# Patient Record
Sex: Male | Born: 1945 | Race: White | Hispanic: No | Marital: Married | Smoking: Never smoker
Health system: Southern US, Community
[De-identification: ages and names within clinical notes are randomized; demographics above are authoritative.]

## PROBLEM LIST (undated history)

## (undated) DIAGNOSIS — I219 Acute myocardial infarction, unspecified: Secondary | ICD-10-CM

## (undated) DIAGNOSIS — I10 Essential (primary) hypertension: Secondary | ICD-10-CM

## (undated) DIAGNOSIS — Z951 Presence of aortocoronary bypass graft: Secondary | ICD-10-CM

## (undated) DIAGNOSIS — T82868A Thrombosis of vascular prosthetic devices, implants and grafts, initial encounter: Secondary | ICD-10-CM

## (undated) HISTORY — PX: TONSILLECTOMY: SUR1361

## (undated) HISTORY — PX: HERNIA REPAIR: SHX51

## (undated) HISTORY — PX: EYE SURGERY: SHX253

## (undated) HISTORY — PX: APPENDECTOMY: SHX54

## (undated) HISTORY — PX: CARDIAC CATHETERIZATION: SHX172

## (undated) HISTORY — PX: JOINT REPLACEMENT: SHX530

## (undated) HISTORY — PX: CORONARY ARTERY BYPASS GRAFT: SHX141

## (undated) HISTORY — PX: VASECTOMY: SHX75

---

## 2011-04-15 ENCOUNTER — Ambulatory Visit: Payer: Self-pay | Admitting: Unknown Physician Specialty

## 2011-10-27 ENCOUNTER — Emergency Department: Payer: Self-pay

## 2011-10-27 LAB — BASIC METABOLIC PANEL
Anion Gap: 9 (ref 7–16)
Chloride: 104 mmol/L (ref 98–107)
Co2: 25 mmol/L (ref 21–32)
Creatinine: 0.99 mg/dL (ref 0.60–1.30)
EGFR (Non-African Amer.): >60
Glucose: 90 mg/dL (ref 65–99)
Osmolality: 277 (ref 275–301)
Potassium: 4.3 mmol/L (ref 3.5–5.1)
Sodium: 138 mmol/L (ref 136–145)

## 2011-10-27 LAB — TROPONIN I
Troponin-I: <0.02 ng/mL
Troponin-I: <0.02 ng/mL

## 2011-10-27 LAB — CBC
Platelet: 181 10*3/uL (ref 150–440)
RDW: 13.7 % (ref 11.5–14.5)

## 2013-03-25 ENCOUNTER — Ambulatory Visit: Payer: Self-pay | Admitting: Unknown Physician Specialty

## 2014-06-20 ENCOUNTER — Emergency Department: Payer: Self-pay | Admitting: Emergency Medicine

## 2015-09-06 ENCOUNTER — Emergency Department: Payer: Medicare Other

## 2015-09-06 ENCOUNTER — Emergency Department
Admission: EM | Admit: 2015-09-06 | Discharge: 2015-09-07 | Disposition: A | Discharge status: Home / Self Care | Payer: Medicare Other | Source: Home / Self Care | Attending: Emergency Medicine | Admitting: Emergency Medicine

## 2015-09-06 ENCOUNTER — Encounter: Payer: Self-pay | Admitting: Emergency Medicine

## 2015-09-06 ENCOUNTER — Encounter: Payer: Self-pay | Admitting: *Deleted

## 2015-09-06 ENCOUNTER — Emergency Department
Admission: EM | Admit: 2015-09-06 | Discharge: 2015-09-06 | Disposition: A | Discharge status: Home / Self Care | Payer: Medicare Other | Attending: Emergency Medicine | Admitting: Emergency Medicine

## 2015-09-06 DIAGNOSIS — R079 Chest pain, unspecified: Secondary | ICD-10-CM

## 2015-09-06 DIAGNOSIS — R42 Dizziness and giddiness: Secondary | ICD-10-CM | POA: Diagnosis not present

## 2015-09-06 DIAGNOSIS — R0789 Other chest pain: Secondary | ICD-10-CM

## 2015-09-06 HISTORY — DX: Acute myocardial infarction, unspecified: I21.9

## 2015-09-06 HISTORY — DX: Essential (primary) hypertension: I10

## 2015-09-06 HISTORY — DX: Thrombosis due to vascular prosthetic devices, implants and grafts, initial encounter: T82.868A

## 2015-09-06 HISTORY — DX: Presence of aortocoronary bypass graft: Z95.1

## 2015-09-06 LAB — BASIC METABOLIC PANEL
Anion gap: 8 (ref 5–15)
BUN: 24 mg/dL — ABNORMAL HIGH (ref 6–20)
CHLORIDE: 101 mmol/L (ref 101–111)
CO2: 26 mmol/L (ref 22–32)
CREATININE: 1.2 mg/dL (ref 0.61–1.24)
Calcium: 8.9 mg/dL (ref 8.9–10.3)
GFR calc non Af Amer: 60 mL/min — ABNORMAL LOW (ref >60)
Glucose, Bld: 89 mg/dL (ref 65–99)
Potassium: 4.4 mmol/L (ref 3.5–5.1)
SODIUM: 135 mmol/L (ref 135–145)

## 2015-09-06 LAB — CBC
HCT: 41.8 % (ref 40.0–52.0)
Hemoglobin: 14.2 g/dL (ref 13.0–18.0)
MCH: 30.4 pg (ref 26.0–34.0)
MCHC: 33.9 g/dL (ref 32.0–36.0)
MCV: 89.6 fL (ref 80.0–100.0)
PLATELETS: 211 10*3/uL (ref 150–440)
RBC: 4.67 MIL/uL (ref 4.40–5.90)
RDW: 13.6 % (ref 11.5–14.5)
WBC: 7.2 10*3/uL (ref 3.8–10.6)

## 2015-09-06 LAB — TROPONIN I

## 2015-09-06 LAB — FIBRIN DERIVATIVES D-DIMER (ARMC ONLY): FIBRIN DERIVATIVES D-DIMER (ARMC): 3373 — AB (ref 0–499)

## 2015-09-06 NOTE — Discharge Instructions (Signed)
You were evaluated for fatigue and left-sided chest pain, and although no certain cause was found, your exam and evaluation are reassuring in the emergency department tonight. I am most suspicious that your left-sided chest discomfort is coming from a musculoskeletal source such as a strained muscle.  You may take over-the-counter Tylenol or ibuprofen for symptoms.  Because of your cardiac history, I do recommend you call your cardiologist in the morning to make close follow-up to discuss any further testing for chest discomfort.  Return to the emergency department immediately for any new or worsening pain, nausea, sweating, dizziness or passing out, weakness, numbness, or any other symptoms concerning to you.   Chest Wall Pain Chest wall pain is pain in or around the bones and muscles of your chest. Sometimes, an injury causes this pain. Sometimes, the cause may not be known. This pain may take several weeks or longer to get better. HOME CARE INSTRUCTIONS  Pay attention to any changes in your symptoms. Take these actions to help with your pain:   Rest as told by your health care provider.   Avoid activities that cause pain. These include any activities that use your chest muscles or your abdominal and side muscles to lift heavy items.   If directed, apply ice to the painful area:  Put ice in a plastic bag.  Place a towel between your skin and the bag.  Leave the ice on for 20 minutes, 2-3 times per day.  Take over-the-counter and prescription medicines only as told by your health care provider.  Do not use tobacco products, including cigarettes, chewing tobacco, and e-cigarettes. If you need help quitting, ask your health care provider.  Keep all follow-up visits as told by your health care provider. This is important. SEEK MEDICAL CARE IF:  You have a fever.  Your chest pain becomes worse.  You have new symptoms. SEEK IMMEDIATE MEDICAL CARE IF:  You have nausea or  vomiting.  You feel sweaty or light-headed.  You have a cough with phlegm (sputum) or you cough up blood.  You develop shortness of breath.   This information is not intended to replace advice given to you by your health care provider. Make sure you discuss any questions you have with your health care provider.   Document Released: 07/21/2005 Document Revised: 04/11/2015 Document Reviewed: 10/16/2014 Elsevier Interactive Patient Education [employer].

## 2015-09-06 NOTE — ED Provider Notes (Signed)
[facility] Emergency Department Provider Note   ____________________________________________  Time seen: Approximately 8:45 PM I have reviewed the triage vital signs and the triage nursing note.  HISTORY  Chief Complaint Chest Pain   Historian Patient and wife  HPI Mario Wolfe is a 70 y.o. male with a history of CABG many years ago, and no additional or ongoing chest pain. He follows with a cardiologist at [facility]. Today he states that he didn't sleep well overnight which is somewhat usual for him but he was feeling pretty exhausted during the day. He felt a little bit lightheaded and when he came home in the afternoon he took a nap when he woke up from the nap he had left-sided chest pain that he noticed was worse when he lifted his arm and moved his arm around the left side. He also had tenderness at the left-sided chest wall when he pressed on it with his hand. No known overuse or traumatic injury specifically.  No trouble breathing, nausea, sweats, extension of the pain.  No weakness or numbness.  He has been off of Plavix since last April on recommendation of his cardiologist, due to no ongoing issues.    Past Medical History  Diagnosis Date  . MI (myocardial infarction) ([place])   . S/P CABG x 3   . Femoral-femoral bypass graft thrombosis, right (HCC)     There are no active problems to display for this patient.   History reviewed. No pertinent past surgical history.  No current outpatient prescriptions on file.  Allergies Bee venom and Tape  History reviewed. No pertinent family history.  Social History Social History  Substance Use Topics  . Smoking status: Never Smoker   . Smokeless tobacco: None  . Alcohol Use: None    Review of Systems  Constitutional: Negative for fever. Eyes: Negative for visual changes. ENT: Negative for sore throat. Cardiovascular: Positive for chest pain. Respiratory: Negative for shortness of  breath Gastrointestinal: Negative for abdominal pain, vomiting and diarrhea. Genitourinary: Negative for dysuria. Musculoskeletal: Negative for back pain. Skin: Negative for rash. Neurological: Negative for headache. 10 point Review of Systems otherwise negative ____________________________________________   PHYSICAL EXAM:  VITAL SIGNS: ED Triage Vitals  Enc Vitals Group     BP 09/06/15 1804 151/75 mmHg     Pulse Rate 09/06/15 1804 71     Resp 09/06/15 1804 18     Temp 09/06/15 1804 98.5 F (36.9 C)     Temp Source 09/06/15 1804 Oral     SpO2 09/06/15 1804 100 %     Weight 09/06/15 1804 240 lb (108.863 kg)     Height 09/06/15 1804 5\' 10"  (1.778 m)     Head Cir --      Peak Flow --      Pain Score 09/06/15 1757 5     Pain Loc --      Pain Edu? --      Excl. in [place]? --      Constitutional: Alert and oriented. Well appearing and in no distress. Eyes: Conjunctivae are normal. PERRL. Normal extraocular movements. ENT   Head: Normocephalic and atraumatic.   Nose: No congestion/rhinnorhea.   Mouth/Throat: Mucous membranes are moist.   Neck: No stridor. Cardiovascular/Chest: Normal rate, regular rhythm.  No murmurs, rubs, or gallops. Left pectoralis muscle tenderness palpation and pain reproducible with range of motion of the left arm as well. Respiratory: Normal respiratory effort without tachypnea nor retractions. Breath sounds are clear and  equal bilaterally. No wheezes/rales/rhonchi. Gastrointestinal: Soft. No distention, no guarding, no rebound. Nontender.    Genitourinary/rectal:Deferred Musculoskeletal: Nontender with normal range of motion in all extremities. No joint effusions.  No lower extremity tenderness.  No edema. Neurologic:  Normal speech and language. No gross or focal neurologic deficits are appreciated. Skin:  Skin is warm, dry and intact. No rash noted. Psychiatric: Mood and affect are normal. Speech and behavior are normal. Patient exhibits  appropriate insight and judgment.  ____________________________________________   EKG I, Lisa Roca, MD, the attending physician have personally viewed and interpreted all ECGs.  63 bpm. Sinus rhythm. Narrow QRS. Normal axis. Nonspecific T-wave ____________________________________________  LABS (pertinent positives/negatives)  Basic metabolic panel without significant abnormalities CBC within normal limits Troponin less than 0.03 D-dimer  3373 ____________________________________________  RADIOLOGY All Xrays were viewed by me. Imaging interpreted by Radiologist.  Chest: No focal pneumonia. Mild atelectasis or scar of left lung base __________________________________________  PROCEDURES  Procedure(s) performed: None  Critical Care performed: None  ____________________________________________   ED COURSE / ASSESSMENT AND PLAN  Pertinent labs & imaging results that were available during my care of the patient were reviewed by me and considered in my medical decision making (see chart for details).   Patient is having atypical chest discomfort which is made worse with palpation over the pectoralis on the left side of the chest as well as with range of motion of his left arm. His medical and laboratory evaluation are reassuring. Chest discomfort started around 2 PM. His EKG is reassuring. We discussed, I do not think a repeat troponin is indicated at this point in time given the onset around 2 PM.  In discussion with his vascular doctor tonight, he asked to also check a d-dimer.   He does not have pleuritic chest pain. He does not a fever. He does not have any hypoxia.  Patient states he is going to see a vascular doctor tomorrow. He will also call his cardiologist in the morning.  CONSULTATIONS:   None   Patient / Family / Caregiver informed of clinical course, medical decision-making process, and agree with plan.   I discussed return precautions, follow-up  instructions, and discharged instructions with patient and/or family.  ----------------------------------------- 9:52 PM on 09/06/2015 ----------------------------------------- Lab called d-dimer, elevated to 3073. I did call the patient back and he is on his way back to the ED to get a CT of the chest to rule out pulmonary embolus him. He did have a vein procedure on his leg this week which was supposed to "sclerose "a vein. This may be the source of the elevated d-dimer, however he's had a CT scan at this point to rule out PE. ___________________________________________   FINAL CLINICAL IMPRESSION(S) / ED DIAGNOSES   Final diagnoses:  Musculoskeletal chest pain              Note: This dictation was prepared with Dragon dictation. Any transcriptional errors that result from this process are unintentional   Lisa Roca, MD 09/06/15 2153

## 2015-09-06 NOTE — ED Notes (Signed)
Pt states left chest pain radaiting around his shoulder, states "I just feel out of it", states hx of MI and CABG

## 2015-09-06 NOTE — ED Provider Notes (Signed)
[facility] Emergency Department Provider Note  ____________________________________________  Time seen: 11:30 PM  I have reviewed the triage vital signs and the nursing notes.   HISTORY  Chief Complaint Abnormal Lab     HPI Mario Wolfe is a 70 y.o. male with history of three-vessel CABG in 2002 as well as recent venous vascular procedure performed on his right leg at [facility]. Patient was seen by Dr. Reita Cliche on 09/06/2015 after being referred by vascular surgeon to come to the emergency department for episode of chest pain that has since resolved. Patient's*surgeon requested that he had a d-dimer performed which was indeed performed during his previous emergency department visit however patient was discharged before to return. D-dimer was indeed elevated and a such patient was called and asked to return to emergency department. Patient has no complaints at this time.    Past Medical History  Diagnosis Date  . MI (myocardial infarction) ([place])   . S/P CABG x 3   . Femoral-femoral bypass graft thrombosis, right (HCC)     There are no active problems to display for this patient.   Past surgical history CABG  No current outpatient prescriptions on file.  Allergies Bee venom and Tape  No family history on file.  Social History Social History  Substance Use Topics  . Smoking status: Never Smoker   . Smokeless tobacco: None  . Alcohol Use: None    Review of Systems  Constitutional: Negative for fever. Eyes: Negative for visual changes. ENT: Negative for sore throat. Cardiovascular: Positive for chest pain since resolved Respiratory: Negative for shortness of breath. Gastrointestinal: Negative for abdominal pain, vomiting and diarrhea. Genitourinary: Negative for dysuria. Musculoskeletal: Negative for back pain. Skin: Negative for rash. Neurological: Negative for headaches, focal weakness or numbness.   10-point ROS otherwise  negative.  ____________________________________________   PHYSICAL EXAM:  VITAL SIGNS: ED Triage Vitals  Enc Vitals Group     BP 09/06/15 2326 120/61 mmHg     Pulse Rate 09/06/15 2326 69     Resp 09/06/15 2326 16     Temp 09/06/15 2326 98.4 F (36.9 C)     Temp Source 09/06/15 2326 Oral     SpO2 09/06/15 2326 98 %     Weight 09/06/15 2326 240 lb (108.863 kg)     Height 09/06/15 2326 5\' 10"  (1.778 m)     Head Cir --      Peak Flow --      Pain Score 09/06/15 2326 5     Pain Loc --      Pain Edu? --      Excl. in [place]? --      Constitutional: Alert and oriented. Well appearing and in no distress. Eyes: Conjunctivae are normal. PERRL. Normal extraocular movements. ENT   Head: Normocephalic and atraumatic.   Nose: No congestion/rhinnorhea.   Mouth/Throat: Mucous membranes are moist.   Neck: No stridor. Hematological/Lymphatic/Immunilogical: No cervical lymphadenopathy. Cardiovascular: Normal rate, regular rhythm. Normal and symmetric distal pulses are present in all extremities. No murmurs, rubs, or gallops. Respiratory: Normal respiratory effort without tachypnea nor retractions. Breath sounds are clear and equal bilaterally. No wheezes/rales/rhonchi. Gastrointestinal: Soft and nontender. No distention. There is no CVA tenderness. Genitourinary: deferred Musculoskeletal: Nontender with normal range of motion in all extremities. No joint effusions.  No lower extremity tenderness nor edema. Neurologic:  Normal speech and language. No gross focal neurologic deficits are appreciated. Speech is normal.  Skin:  Skin is warm, dry  and intact. No rash noted. Psychiatric: Mood and affect are normal. Speech and behavior are normal. Patient exhibits appropriate insight and judgment.  ____________________________________________    LABS (pertinent positives/negatives)  Labs Reviewed  TROPONIN I       RADIOLOGY  US Venous Img Lower Unilateral Right (Final result)  Result time: 09/07/15 02:32:00   Final result by Rad Results In Interface (09/07/15 02:32:00)   Narrative:   CLINICAL DATA: Chest pain. Elevated D-dimer. Patient reports history of right femoral artery bypass many years prior.  EXAM: RIGHT LOWER EXTREMITY VENOUS DOPPLER ULTRASOUND  TECHNIQUE: Gray-scale sonography with graded compression, as well as color Doppler and duplex ultrasound were performed to evaluate the lower extremity deep venous systems from the level of the common femoral vein and including the common femoral, femoral, profunda femoral, popliteal and calf veins including the posterior tibial, peroneal and gastrocnemius veins when visible. The superficial great saphenous vein was also interrogated. Spectral Doppler was utilized to evaluate flow at rest and with distal augmentation maneuvers in the common femoral, femoral and popliteal veins.  COMPARISON: None.  FINDINGS: Contralateral Common Femoral Vein: Respiratory phasicity is normal and symmetric with the symptomatic side. No evidence of thrombus. Normal compressibility.  Common Femoral Vein: No evidence of thrombus. Normal compressibility, respiratory phasicity and response to augmentation.  Saphenofemoral Junction: No evidence of thrombus. Normal compressibility and flow on color Doppler imaging.  Profunda Femoral Vein: No evidence of thrombus. Normal compressibility and flow on color Doppler imaging.  Femoral Vein: No evidence of thrombus. Normal compressibility, respiratory phasicity and response to augmentation.  Popliteal Vein: No evidence of thrombus. Normal compressibility, respiratory phasicity and response to augmentation.  Calf Veins: No evidence of thrombus. Normal compressibility and flow on color Doppler imaging.  Superficial lesser Saphenous Vein: Not visualized, may be surgically absent.  Venous Reflux: None.  Other Findings: None.  IMPRESSION: No evidence of right lower  extremity deep venous thrombosis.   Electronically Signed By: Jeb Levering M.D. On: 09/07/2015 02:32          CT Angio Chest PE W/Cm &/Or Wo Cm (Final result) Result time: 09/07/15 00:57:20   Final result by Rad Results In Interface (09/07/15 00:57:20)   Narrative:   CLINICAL DATA: Left chest pain. History of DVT and recent vascular surgery  EXAM: CT ANGIOGRAPHY CHEST WITH CONTRAST  TECHNIQUE: Multidetector CT imaging of the chest was performed using the standard protocol during bolus administration of intravenous contrast. Multiplanar CT image reconstructions and MIPs were obtained to evaluate the vascular anatomy.  CONTRAST: 145mL OMNIPAQUE IOHEXOL 350 MG/ML SOLN  COMPARISON: 10/27/2011  FINDINGS: THORACIC INLET/BODY WALL:  No acute abnormality.  Symmetric gynecomastia  MEDIASTINUM:  Normal heart size. No pericardial effusion. Extensive atherosclerotic calcification, including the coronary arteries. The patient is status post CABG. Proximal saphenous vein grafts and LIMA are opacified. No aortic aneurysm or dissection. No evidence of pulmonary embolism. Remote granulomatous disease seen in right hilar bronchial lymph nodes.  LUNG WINDOWS:  Mild diffuse bronchial wall thickening, stable. No consolidation. No effusion. No new, enlarging, or otherwise suspicious nodule.  UPPER ABDOMEN:  No acute finding. 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  OSSEOUS:  No acute fracture. No suspicious lytic or blastic lesions.  Review of the MIP images confirms the above findings.  IMPRESSION: Negative for pulmonary embolism or other acute finding.   Electronically Signed By: Monte Fantasia M.D. On: 09/07/2015 00:57  INITIAL IMPRESSION / ASSESSMENT AND PLAN / ED COURSE  Pertinent labs & imaging results that were available during my care of the patient were  reviewed by me and considered in my medical decision making (see chart for details).  CT angiogram of the chest revealed no evidence of PE right lower extremity ultrasound revealed no DVT. Patient's elevated d-dimer possibly secondary to recent vascular surgery.  ____________________________________________   FINAL CLINICAL IMPRESSION(S) / ED DIAGNOSES  Final diagnoses:  Chest pain, unspecified chest pain type      Gregor Hams, MD 09/11/15 [phone]

## 2015-09-06 NOTE — ED Notes (Signed)
Pt arrived to ER POV with c/o of elevated d-dimer. Pt was seen in the ER earlier today and was d/c. Pt was notified via telephone on elevation of d-dimer and advised to return to ER for further medical treatment.

## 2015-09-07 ENCOUNTER — Emergency Department: Payer: Medicare Other

## 2015-09-07 ENCOUNTER — Encounter: Payer: Self-pay | Admitting: Radiology

## 2015-09-07 DIAGNOSIS — R0789 Other chest pain: Secondary | ICD-10-CM | POA: Diagnosis not present

## 2015-09-07 LAB — TROPONIN I: Troponin I: <0.03 ng/mL (ref <0.031)

## 2015-09-07 MED ORDER — IOHEXOL 350 MG/ML SOLN
100.0000 mL | Freq: Once | INTRAVENOUS | Status: AC | PRN
  Administered 2015-09-07: 100 mL via INTRAVENOUS

## 2015-09-07 NOTE — Discharge Instructions (Signed)
Nonspecific Chest Pain  °Chest pain can be caused by many different conditions. There is always a chance that your pain could be related to something serious, such as a heart attack or a blood clot in your lungs. Chest pain can also be caused by conditions that are not life-threatening. If you have chest pain, it is very important to follow up with your health care provider. °CAUSES  °Chest pain can be caused by: °· Heartburn. °· Pneumonia or bronchitis. °· Anxiety or stress. °· Inflammation around your heart (pericarditis) or lung (pleuritis or pleurisy). °· A blood clot in your lung. °· A collapsed lung (pneumothorax). It can develop suddenly on its own (spontaneous pneumothorax) or from trauma to the chest. °· Shingles infection (varicella-zoster virus). °· Heart attack. °· Damage to the bones, muscles, and cartilage that make up your chest wall. This can include: °¨ Bruised bones due to injury. °¨ Strained muscles or cartilage due to frequent or repeated coughing or overwork. °¨ Fracture to one or more ribs. °¨ Sore cartilage due to inflammation (costochondritis). °RISK FACTORS  °Risk factors for chest pain may include: °· Activities that increase your risk for trauma or injury to your chest. °· Respiratory infections or conditions that cause frequent coughing. °· Medical conditions or overeating that can cause heartburn. °· Heart disease or family history of heart disease. °· Conditions or health behaviors that increase your risk of developing a blood clot. °· Having had chicken pox (varicella zoster). °SIGNS AND SYMPTOMS °Chest pain can feel like: °· Burning or tingling on the surface of your chest or deep in your chest. °· Crushing, pressure, aching, or squeezing pain. °· Dull or sharp pain that is worse when you move, cough, or take a deep breath. °· Pain that is also felt in your back, neck, shoulder, or arm, or pain that spreads to any of these areas. °Your chest pain may come and go, or it may stay  constant. °DIAGNOSIS °Lab tests or other studies may be needed to find the cause of your pain. Your health care provider may have you take a test called an ambulatory ECG (electrocardiogram). An ECG records your heartbeat patterns at the time the test is performed. You may also have other tests, such as: °· Transthoracic echocardiogram (TTE). During echocardiography, sound waves are used to create a picture of all of the heart structures and to look at how blood flows through your heart. °· Transesophageal echocardiogram (TEE). This is a more advanced imaging test that obtains images from inside your body. It allows your health care provider to see your heart in finer detail. °· Cardiac monitoring. This allows your health care provider to monitor your heart rate and rhythm in real time. °· Holter monitor. This is a portable device that records your heartbeat and can help to diagnose abnormal heartbeats. It allows your health care provider to track your heart activity for several days, if needed. °· Stress tests. These can be done through exercise or by taking medicine that makes your heart beat more quickly. °· Blood tests. °· Imaging tests. °TREATMENT  °Your treatment depends on what is causing your chest pain. Treatment may include: °· Medicines. These may include: °¨ Acid blockers for heartburn. °¨ Anti-inflammatory medicine. °¨ Pain medicine for inflammatory conditions. °¨ Antibiotic medicine, if an infection is present. °¨ Medicines to dissolve blood clots. °¨ Medicines to treat coronary artery disease. °· Supportive care for conditions that do not require medicines. This may include: °¨ Resting. °¨ Applying heat   or cold packs to injured areas. °¨ Limiting activities until pain decreases. °HOME CARE INSTRUCTIONS °· If you were prescribed an antibiotic medicine, finish it all even if you start to feel better. °· Avoid any activities that bring on chest pain. °· Do not use any tobacco products, including  cigarettes, chewing tobacco, or electronic cigarettes. If you need help quitting, ask your health care provider. °· Do not drink alcohol. °· Take medicines only as directed by your health care provider. °· Keep all follow-up visits as directed by your health care provider. This is important. This includes any further testing if your chest pain does not go away. °· If heartburn is the cause for your chest pain, you may be told to keep your head raised (elevated) while sleeping. This reduces the chance that acid will go from your stomach into your esophagus. °· Make lifestyle changes as directed by your health care provider. These may include: °¨ Getting regular exercise. Ask your health care provider to suggest some activities that are safe for you. °¨ Eating a heart-healthy diet. A registered dietitian can help you to learn healthy eating options. °¨ Maintaining a healthy weight. °¨ Managing diabetes, if necessary. °¨ Reducing stress. °SEEK MEDICAL CARE IF: °· Your chest pain does not go away after treatment. °· You have a rash with blisters on your chest. °· You have a fever. °SEEK IMMEDIATE MEDICAL CARE IF:  °· Your chest pain is worse. °· You have an increasing cough, or you cough up blood. °· You have severe abdominal pain. °· You have severe weakness. °· You faint. °· You have chills. °· You have sudden, unexplained chest discomfort. °· You have sudden, unexplained discomfort in your arms, back, neck, or jaw. °· You have shortness of breath at any time. °· You suddenly start to sweat, or your skin gets clammy. °· You feel nauseous or you vomit. °· You suddenly feel light-headed or dizzy. °· Your heart begins to beat quickly, or it feels like it is skipping beats. °These symptoms may represent a serious problem that is an emergency. Do not wait to see if the symptoms will go away. Get medical help right away. Call your local emergency services (911 in the [place] Do not drive yourself to the hospital. °  °This  information is not intended to replace advice given to you by your health care provider. Make sure you discuss any questions you have with your health care provider. °  °Document Released: 04/30/2005 Document Revised: 08/11/2014 Document Reviewed: 02/24/2014 °Elsevier Interactive Patient Education ©2016 [employer] ° °

## 2015-09-07 NOTE — ED Notes (Signed)
Patient with no complaints at this time. Respirations even and unlabored. Skin warm/dry. Discharge instructions reviewed with patient at this time. Patient given opportunity to voice concerns/ask questions. IV removed per policy and band-aid applied to site. Patient discharged at this time and left Emergency Department with steady gait.  

## 2015-09-07 NOTE — ED Notes (Signed)
Patient back from CT.

## 2015-09-07 NOTE — ED Notes (Signed)
Patient transported to CT 

## 2016-09-02 ENCOUNTER — Encounter: Payer: Self-pay | Admitting: *Deleted

## 2016-09-03 ENCOUNTER — Ambulatory Visit: Payer: Medicare Other | Admitting: Anesthesiology

## 2016-09-03 ENCOUNTER — Encounter
Admission: RE | Disposition: A | Discharge status: Home / Self Care | Payer: Self-pay | Source: Ambulatory Visit | Attending: Unknown Physician Specialty

## 2016-09-03 ENCOUNTER — Encounter: Payer: Self-pay | Admitting: *Deleted

## 2016-09-03 ENCOUNTER — Ambulatory Visit
Admission: RE | Admit: 2016-09-03 | Discharge: 2016-09-03 | Disposition: A | Discharge status: Home / Self Care | Payer: Medicare Other | Source: Ambulatory Visit | Attending: Unknown Physician Specialty | Admitting: Unknown Physician Specialty

## 2016-09-03 DIAGNOSIS — Z79899 Other long term (current) drug therapy: Secondary | ICD-10-CM | POA: Insufficient documentation

## 2016-09-03 DIAGNOSIS — I10 Essential (primary) hypertension: Secondary | ICD-10-CM | POA: Insufficient documentation

## 2016-09-03 DIAGNOSIS — K640 First degree hemorrhoids: Secondary | ICD-10-CM | POA: Diagnosis not present

## 2016-09-03 DIAGNOSIS — E669 Obesity, unspecified: Secondary | ICD-10-CM | POA: Insufficient documentation

## 2016-09-03 DIAGNOSIS — D122 Benign neoplasm of ascending colon: Secondary | ICD-10-CM | POA: Insufficient documentation

## 2016-09-03 DIAGNOSIS — Z1211 Encounter for screening for malignant neoplasm of colon: Secondary | ICD-10-CM | POA: Insufficient documentation

## 2016-09-03 DIAGNOSIS — Z951 Presence of aortocoronary bypass graft: Secondary | ICD-10-CM | POA: Diagnosis not present

## 2016-09-03 DIAGNOSIS — I252 Old myocardial infarction: Secondary | ICD-10-CM | POA: Diagnosis not present

## 2016-09-03 DIAGNOSIS — D127 Benign neoplasm of rectosigmoid junction: Secondary | ICD-10-CM | POA: Insufficient documentation

## 2016-09-03 DIAGNOSIS — Z800 Family history of malignant neoplasm of digestive organs: Secondary | ICD-10-CM | POA: Diagnosis not present

## 2016-09-03 DIAGNOSIS — Z6833 Body mass index (BMI) 33.0-33.9, adult: Secondary | ICD-10-CM | POA: Insufficient documentation

## 2016-09-03 DIAGNOSIS — K5730 Diverticulosis of large intestine without perforation or abscess without bleeding: Secondary | ICD-10-CM | POA: Insufficient documentation

## 2016-09-03 DIAGNOSIS — N289 Disorder of kidney and ureter, unspecified: Secondary | ICD-10-CM | POA: Diagnosis not present

## 2016-09-03 HISTORY — PX: COLONOSCOPY WITH PROPOFOL: SHX5780

## 2016-09-03 SURGERY — COLONOSCOPY WITH PROPOFOL
Anesthesia: General

## 2016-09-03 MED ORDER — PROPOFOL 10 MG/ML IV BOLUS
INTRAVENOUS | Status: AC
  Filled 2016-09-03: qty 40

## 2016-09-03 MED ORDER — LIDOCAINE HCL (PF) 2 % IJ SOLN
INTRAMUSCULAR | Status: DC | PRN
  Administered 2016-09-03: 50 mg

## 2016-09-03 MED ORDER — FENTANYL CITRATE (PF) 100 MCG/2ML IJ SOLN
INTRAMUSCULAR | Status: AC
  Filled 2016-09-03: qty 2

## 2016-09-03 MED ORDER — PROPOFOL 10 MG/ML IV BOLUS
INTRAVENOUS | Status: DC | PRN
  Administered 2016-09-03: 50 mg via INTRAVENOUS

## 2016-09-03 MED ORDER — MIDAZOLAM HCL 2 MG/2ML IJ SOLN
INTRAMUSCULAR | Status: AC
  Filled 2016-09-03: qty 2

## 2016-09-03 MED ORDER — PROPOFOL 500 MG/50ML IV EMUL
INTRAVENOUS | Status: AC
  Filled 2016-09-03: qty 50

## 2016-09-03 MED ORDER — FENTANYL CITRATE (PF) 100 MCG/2ML IJ SOLN
INTRAMUSCULAR | Status: DC | PRN
  Administered 2016-09-03 (×2): 25 ug via INTRAVENOUS
  Administered 2016-09-03: 50 ug via INTRAVENOUS

## 2016-09-03 MED ORDER — PROPOFOL 500 MG/50ML IV EMUL
INTRAVENOUS | Status: DC | PRN
  Administered 2016-09-03: 75 ug/kg/min via INTRAVENOUS

## 2016-09-03 MED ORDER — SODIUM CHLORIDE 0.9 % IV SOLN: INTRAVENOUS | Status: DC

## 2016-09-03 MED ORDER — PIPERACILLIN-TAZOBACTAM 3.375 G IVPB 30 MIN
3.3750 g | Freq: Once | INTRAVENOUS | Status: AC
  Administered 2016-09-03: 3.375 g via INTRAVENOUS
  Filled 2016-09-03: qty 50

## 2016-09-03 MED ORDER — PHENYLEPHRINE HCL 10 MG/ML IJ SOLN
INTRAMUSCULAR | Status: DC | PRN
  Administered 2016-09-03: 100 ug via INTRAVENOUS

## 2016-09-03 MED ORDER — SODIUM CHLORIDE 0.9 % IV SOLN
INTRAVENOUS | Status: DC
  Administered 2016-09-03: 14:00:00 via INTRAVENOUS

## 2016-09-03 MED ORDER — MIDAZOLAM HCL 5 MG/5ML IJ SOLN
INTRAMUSCULAR | Status: DC | PRN
  Administered 2016-09-03: 2 mg via INTRAVENOUS

## 2016-09-03 NOTE — Anesthesia Procedure Notes (Signed)
Date/Time: 09/03/2016 3:18 PM Performed by: Nelda Marseille Pre-anesthesia Checklist: Patient identified, Emergency Drugs available, Suction available, Patient being monitored and Timeout performed Oxygen Delivery Method: Nasal cannula

## 2016-09-03 NOTE — Anesthesia Preprocedure Evaluation (Signed)
Anesthesia Evaluation  Patient identified by MRN, date of birth, ID band Patient awake    Reviewed: Allergy & Precautions, NPO status , Patient's Chart, lab work & pertinent test results  History of Anesthesia Complications Negative for: history of anesthetic complications  Airway Mallampati: II       Dental  (+) Teeth Intact   Pulmonary neg pulmonary ROS,     + decreased breath sounds      Cardiovascular hypertension, Pt. on medications and Pt. on home beta blockers + Past MI   Rhythm:Regular Rate:Normal     Neuro/Psych negative neurological ROS  negative psych ROS   GI/Hepatic negative GI ROS, Neg liver ROS,   Endo/Other  Morbid obesity  Renal/GU Renal disease     Musculoskeletal   Abdominal (+) + obese,   Peds  Hematology   Anesthesia Other Findings   Reproductive/Obstetrics                             Anesthesia Physical Anesthesia Plan  ASA: III  Anesthesia Plan: General   Post-op Pain Management:    Induction: Intravenous  Airway Management Planned: Natural Airway and Nasal Cannula  Additional Equipment:   Intra-op Plan:   Post-operative Plan:   Informed Consent: I have reviewed the patients History and Physical, chart, labs and discussed the procedure including the risks, benefits and alternatives for the proposed anesthesia with the patient or authorized representative who has indicated his/her understanding and acceptance.     Plan Discussed with: CRNA and Surgeon  Anesthesia Plan Comments:         Anesthesia Quick Evaluation

## 2016-09-03 NOTE — Transfer of Care (Signed)
Immediate Anesthesia Transfer of Care Note  Patient: Mario Wolfe  Procedure(s) Performed: Procedure(s): COLONOSCOPY WITH PROPOFOL (N/A)  Patient Location: PACU  Anesthesia Type:General  Level of Consciousness: sedated  Airway & Oxygen Therapy: Patient Spontanous Breathing and Patient connected to nasal cannula oxygen  Post-op Assessment: Report given to RN and Post -op Vital signs reviewed and stable  Post vital signs: Reviewed and stable  Last Vitals:  Vitals:   09/03/16 1332  BP: (!) 83/71  Pulse: 60  Resp: 20  Temp: 36.6 C    Last Pain: There were no vitals filed for this visit.       Complications: No apparent anesthesia complications

## 2016-09-03 NOTE — Anesthesia Post-op Follow-up Note (Cosign Needed)
Anesthesia QCDR form completed.        

## 2016-09-03 NOTE — H&P (Signed)
Primary Care Physician:  Lolita Patella, MD Primary Gastroenterologist:  Dr. Vira Agar  Pre-Procedure History & Physical: HPI:  Mario Wolfe is a 71 y.o. male is here for an colonoscopy.   Past Medical History:  Diagnosis Date  . Femoral-femoral bypass graft thrombosis, right ([place])   . Hypertension   . MI (myocardial infarction)   . S/P CABG x 3     Past Surgical History:  Procedure Laterality Date  . APPENDECTOMY    . CARDIAC CATHETERIZATION    . CORONARY ARTERY BYPASS GRAFT    . EYE SURGERY    . HERNIA REPAIR    . JOINT REPLACEMENT    . TONSILLECTOMY    . VASECTOMY      Prior to Admission medications   Medication Sig Start Date End Date Taking? Authorizing Provider  atorvastatin (LIPITOR) 80 MG tablet Take 80 mg by mouth daily.   Yes Historical Provider, MD  carvedilol (COREG) 12.5 MG tablet Take 12.5 mg by mouth 2 (two) times daily with a meal.   Yes Historical Provider, MD  clindamycin (CLEOCIN) 300 MG capsule Take 300 mg by mouth 3 (three) times daily.   Yes Historical Provider, MD  ezetimibe (ZETIA) 10 MG tablet Take 10 mg by mouth daily.   Yes Historical Provider, MD  hydrochlorothiazide (MICROZIDE) 12.5 MG capsule Take 12.5 mg by mouth daily.   Yes Historical Provider, MD  levothyroxine (SYNTHROID, LEVOTHROID) 100 MCG tablet Take 100 mcg by mouth daily before breakfast.   Yes Historical Provider, MD  lisinopril (PRINIVIL,ZESTRIL) 20 MG tablet Take 20 mg by mouth daily.   Yes Historical Provider, MD  zolpidem (AMBIEN) 10 MG tablet Take 10 mg by mouth at bedtime as needed for sleep.   Yes Historical Provider, MD  diclofenac sodium (VOLTAREN) 1 % GEL Apply 2 g topically 4 (four) times daily.    Historical Provider, MD    Allergies as of 08/14/2016 - Review Complete 09/07/2015  Allergen Reaction Noted  . Bee venom  09/06/2015  . Tape  09/06/2015    History reviewed. No pertinent family history.  Social History   Social History  . Marital status: Married   Spouse name: N/A  . Number of children: N/A  . Years of education: N/A   Occupational History  . Not on file.   Social History Main Topics  . Smoking status: Never Smoker  . Smokeless tobacco: Former Systems developer  . Alcohol use Yes  . Drug use: No  . Sexual activity: Not on file   Other Topics Concern  . Not on file   Social History Narrative  . No narrative on file    Review of Systems: See HPI, otherwise negative ROS  Physical Exam: BP (!) 83/71   Pulse 60   Temp 97.9 F (36.6 C)   Resp 20   Ht 5\' 10"  (1.778 m)   Wt 106.1 kg (234 lb)   SpO2 100%   BMI 33.58 kg/m  General:   Alert,  pleasant and cooperative in NAD Head:  Normocephalic and atraumatic. Neck:  Supple; no masses or thyromegaly. Lungs:  Clear throughout to auscultation.    Heart:  Regular rate and rhythm. Abdomen:  Soft, nontender and nondistended. Normal bowel sounds, without guarding, and without rebound.   Neurologic:  Alert and  oriented x4;  grossly normal neurologically.  Impression/Plan: OTHER SCHNITZER is here for an colonoscopy to be performed for screening for family history of colon cancer in father  Risks, benefits, limitations,  and alternatives regarding  colonoscopy have been reviewed with the patient.  Questions have been answered.  All parties agreeable.   Gaylyn Cheers, MD  09/03/2016, 3:00 PM

## 2016-09-03 NOTE — Op Note (Signed)
[facility] Gastroenterology Patient Name: Mario Wolfe Procedure Date: 09/03/2016 2:25 PM MRN: [id number] Account #: [id number] Date of Birth: October 29, 1945 Admit Type: Outpatient Age: 71 Room: [facility] ENDO ROOM 4 Gender: Male Note Status: Finalized Procedure:            Colonoscopy Indications:          Screening in patient at increased risk: Family history                        of 1st-degree relative with colorectal cancer Providers:            Manya Silvas, MD Referring MD:         Bo Mcclintock. Vickki Muff (Referring MD) Medicines:            Propofol per Anesthesia Complications:        No immediate complications. Procedure:            Pre-Anesthesia Assessment:                       - After reviewing the risks and benefits, the patient                        was deemed in satisfactory condition to undergo the                        procedure.                       After obtaining informed consent, the colonoscope was                        passed under direct vision. Throughout the procedure,                        the patient's blood pressure, pulse, and oxygen                        saturations were monitored continuously. The                        Colonoscope was introduced through the anus and                        advanced to the the cecum, identified by appendiceal                        orifice and ileocecal valve. The colonoscopy was                        performed without difficulty. The patient tolerated the                        procedure well. The quality of the bowel preparation                        was adequate to identify polyps 6 mm and larger in size. Findings:      A small polyp was found in the ascending colon. The polyp was sessile.       The polyp was removed with a hot snare. Resection and retrieval were  complete.      Many small-mouthed diverticula were found in the sigmoid colon.      Internal hemorrhoids were found during  endoscopy. The hemorrhoids were       small and Grade I (internal hemorrhoids that do not prolapse).      A small polyp was found in the recto-sigmoid colon. The polyp was       sessile. The polyp was removed with a hot snare. Resection and retrieval       were complete. To close a defect after polypectomy, one hemostatic clip       was successfully placed. There was no bleeding during, or at the end, of       the procedure.      The exam was otherwise without abnormality. Impression:           - One small polyp in the ascending colon, removed with                        a hot snare. Resected and retrieved.                       - Diverticulosis in the sigmoid colon.                       - Internal hemorrhoids.                       - One small polyp at the recto-sigmoid colon, removed                        with a hot snare. Resected and retrieved. Clip was                        placed.                       - The examination was otherwise normal. Recommendation:       - Await pathology results. Manya Silvas, MD 09/03/2016 3:34:35 PM This report has been signed electronically. Number of Addenda: 0 Note Initiated On: 09/03/2016 2:25 PM Scope Withdrawal Time: 0 hours 18 minutes 14 seconds  Total Procedure Duration: 0 hours 24 minutes 58 seconds       [facility]

## 2016-09-03 NOTE — Anesthesia Postprocedure Evaluation (Signed)
Anesthesia Post Note  Patient: Mario Wolfe  Procedure(s) Performed: Procedure(s) (LRB): COLONOSCOPY WITH PROPOFOL (N/A)  Patient location during evaluation: PACU Anesthesia Type: General Level of consciousness: awake and alert and oriented Pain management: pain level controlled Vital Signs Assessment: post-procedure vital signs reviewed and stable Respiratory status: spontaneous breathing Cardiovascular status: blood pressure returned to baseline Anesthetic complications: no     Last Vitals:  Vitals:   09/03/16 1557 09/03/16 1607  BP: (!) 108/57 (!) 114/59  Pulse: (!) 55 (!) 57  Resp: 15 18  Temp:      Last Pain:  Vitals:   09/03/16 1537  TempSrc: Tympanic                 [name]

## 2016-09-04 ENCOUNTER — Encounter: Payer: Self-pay | Admitting: Unknown Physician Specialty

## 2016-09-05 LAB — SURGICAL PATHOLOGY

## 2018-10-30 ENCOUNTER — Emergency Department
Admission: EM | Admit: 2018-10-30 | Discharge: 2018-10-30 | Disposition: A | Discharge status: Other Acute Inpatient Hospital | Payer: Medicare Other | Attending: Student in an Organized Health Care Education/Training Program | Admitting: Student in an Organized Health Care Education/Training Program

## 2018-10-30 ENCOUNTER — Emergency Department: Payer: Medicare Other

## 2018-10-30 DIAGNOSIS — I998 Other disorder of circulatory system: Secondary | ICD-10-CM | POA: Insufficient documentation

## 2018-10-30 DIAGNOSIS — Z951 Presence of aortocoronary bypass graft: Secondary | ICD-10-CM | POA: Diagnosis not present

## 2018-10-30 DIAGNOSIS — I252 Old myocardial infarction: Secondary | ICD-10-CM | POA: Insufficient documentation

## 2018-10-30 DIAGNOSIS — Z91040 Latex allergy status: Secondary | ICD-10-CM | POA: Insufficient documentation

## 2018-10-30 DIAGNOSIS — M79671 Pain in right foot: Secondary | ICD-10-CM | POA: Diagnosis present

## 2018-10-30 DIAGNOSIS — Z79899 Other long term (current) drug therapy: Secondary | ICD-10-CM | POA: Diagnosis not present

## 2018-10-30 DIAGNOSIS — I70229 Atherosclerosis of native arteries of extremities with rest pain, unspecified extremity: Secondary | ICD-10-CM

## 2018-10-30 LAB — CBC WITH DIFFERENTIAL/PLATELET
Abs Immature Granulocytes: 0.02 10*3/uL (ref 0.00–0.07)
BASOS ABS: 0 10*3/uL (ref 0.0–0.1)
BASOS PCT: 1 %
EOS ABS: 0.2 10*3/uL (ref 0.0–0.5)
EOS PCT: 3 %
HCT: 40.8 % (ref 39.0–52.0)
HEMOGLOBIN: 13.4 g/dL (ref 13.0–17.0)
IMMATURE GRANULOCYTES: 0 %
LYMPHS ABS: 1.5 10*3/uL (ref 0.7–4.0)
Lymphocytes Relative: 21 %
MCH: 30.5 pg (ref 26.0–34.0)
MCHC: 32.8 g/dL (ref 30.0–36.0)
MCV: 92.7 fL (ref 80.0–100.0)
Monocytes Absolute: 0.7 10*3/uL (ref 0.1–1.0)
Monocytes Relative: 10 %
NEUTROS PCT: 65 %
Neutro Abs: 4.8 10*3/uL (ref 1.7–7.7)
Platelets: 169 10*3/uL (ref 150–400)
RBC: 4.4 MIL/uL (ref 4.22–5.81)
RDW: 13.6 % (ref 11.5–15.5)
WBC: 7.2 10*3/uL (ref 4.0–10.5)
nRBC: 0 % (ref 0.0–0.2)

## 2018-10-30 LAB — BASIC METABOLIC PANEL
Anion gap: 8 (ref 5–15)
BUN: 34 mg/dL — ABNORMAL HIGH (ref 8–23)
CO2: 23 mmol/L (ref 22–32)
Calcium: 8.5 mg/dL — ABNORMAL LOW (ref 8.9–10.3)
Chloride: 105 mmol/L (ref 98–111)
Creatinine, Ser: 1.31 mg/dL — ABNORMAL HIGH (ref 0.61–1.24)
GFR calc Af Amer: >60 mL/min (ref >60)
GFR, EST NON AFRICAN AMERICAN: 53 mL/min — AB (ref >60)
GLUCOSE: 105 mg/dL — AB (ref 70–99)
POTASSIUM: 5.2 mmol/L — AB (ref 3.5–5.1)
Sodium: 136 mmol/L (ref 135–145)

## 2018-10-30 LAB — PROTIME-INR
INR: 1 (ref 0.8–1.2)
Prothrombin Time: 13.3 seconds (ref 11.4–15.2)

## 2018-10-30 LAB — APTT: aPTT: 30 seconds (ref 24–36)

## 2018-10-30 MED ORDER — LIDOCAINE HCL (PF) 1 % IJ SOLN
5.0000 mL | Freq: Once | INTRAMUSCULAR | Status: DC
  Filled 2018-10-30: qty 5

## 2018-10-30 MED ORDER — HEPARIN BOLUS VIA INFUSION
5000.0000 [IU] | Freq: Once | INTRAVENOUS | Status: AC
  Administered 2018-10-30: 5000 [IU] via INTRAVENOUS
  Filled 2018-10-30: qty 5000

## 2018-10-30 MED ORDER — IOPAMIDOL (ISOVUE-370) INJECTION 76%
125.0000 mL | Freq: Once | INTRAVENOUS | Status: AC | PRN
  Administered 2018-10-30: 125 mL via INTRAVENOUS

## 2018-10-30 MED ORDER — MORPHINE SULFATE (PF) 4 MG/ML IV SOLN
4.0000 mg | INTRAVENOUS | Status: DC | PRN
  Administered 2018-10-30: 4 mg via INTRAVENOUS
  Filled 2018-10-30: qty 1

## 2018-10-30 MED ORDER — HEPARIN (PORCINE) 25000 UT/250ML-% IV SOLN
1500.0000 [IU]/h | INTRAVENOUS | Status: DC
  Administered 2018-10-30: 1500 [IU]/h via INTRAVENOUS
  Filled 2018-10-30: qty 250

## 2018-10-30 MED ORDER — ONDANSETRON HCL 4 MG/2ML IJ SOLN
4.0000 mg | Freq: Once | INTRAMUSCULAR | Status: AC
  Administered 2018-10-30: 4 mg via INTRAVENOUS
  Filled 2018-10-30: qty 2

## 2018-10-30 MED ORDER — SODIUM CHLORIDE 0.9 % IV BOLUS
500.0000 mL | Freq: Once | INTRAVENOUS | Status: AC
  Administered 2018-10-30: 500 mL via INTRAVENOUS

## 2018-10-30 NOTE — ED Triage Notes (Signed)
Pt here via ems /with report of right foot pain and swelling Mario Wolfe /tingling . A/Ox 3 .

## 2018-10-30 NOTE — ED Notes (Signed)
Pt reports last PO intake around 1800 yesterday .

## 2018-10-30 NOTE — Progress Notes (Signed)
ANTICOAGULATION CONSULT NOTE - Initial Consult  Pharmacy Consult for heparin Indication: DVT  Allergies  Allergen Reactions  . Bee Venom   . Crestor [Rosuvastatin]   . Latex   . Tape     Patient Measurements: Height: 5\' 10"  (177.8 cm) Weight: 231 lb (104.8 kg) IBW/kg (Calculated) : 73 Heparin Dosing Weight: 85 kg  Vital Signs: Temp: 98.2 F (36.8 C) (03/28 0418) Temp Source: Oral (03/28 0418) BP: 144/75 (03/28 0418) Pulse Rate: 65 (03/28 0418)  Labs: Recent Labs    10/30/18 0427  HGB 13.4  HCT 40.8  PLT 169  LABPROT 13.3  INR 1.0  CREATININE 1.31*    Estimated Creatinine Clearance: 60 mL/min (A) (by C-G formula based on SCr of 1.31 mg/dL (H)).   Medical History: Past Medical History:  Diagnosis Date  . Femoral-femoral bypass graft thrombosis, right ([place])   . Hypertension   . MI (myocardial infarction) ([place])   . S/P CABG x 3     Medications:  Scheduled:  . heparin  5,000 Units Intravenous Once    Assessment: Patient arrives for right foot pain w/ swelling, numbness, tingling.  No anticoagulation PTA. Patient is being started on heparin drip for DVT of RLE  Goal of Therapy:  Heparin level 0.3-0.7 units/ml Monitor platelets by anticoagulation protocol: Yes   Plan:  Will bolus heparin 5000 units IV x 1 Will start drip @ 1500 units/hr Baseline labs WNL Will check anti-Xa @ 1400 Will monitor daily CBC's and adjust per anti-Xa levels.  Tobie Lords, PharmD, BCPS Clinical Pharmacist 10/30/2018

## 2018-10-30 NOTE — ED Provider Notes (Signed)
[facility] Emergency Department Provider Note    First MD Initiated Contact with Patient 10/30/18 [phone]     (approximate)  I have reviewed the triage vital signs and the nursing notes.   HISTORY  Chief Complaint Foot Pain    HPI Mario Wolfe is a 73 y.o. male with extensive vascular history right leg status post femorofemoral bypass not on any anticoagulation presents the ER with acute onset of right foot pain.  States it started around 1 AM.  Denies any trauma or fevers.  No medication changes.  He does not currently smoke.  States the pain is severe he is having numbness and tingling to the right foot.  Denies any abdominal pain or chest pain.  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    Past Medical History:  Diagnosis Date  . Femoral-femoral bypass graft thrombosis, right ([place])   . Hypertension   . MI (myocardial infarction) ([place])   . S/P CABG x 3    No family history on file. Past Surgical History:  Procedure Laterality Date  . APPENDECTOMY    . CARDIAC CATHETERIZATION    . COLONOSCOPY WITH PROPOFOL N/A 09/03/2016   Procedure: COLONOSCOPY WITH PROPOFOL;  Surgeon: Manya Silvas, MD;  Location: [facility] ENDOSCOPY;  Service: Endoscopy;  Laterality: N/A;  . CORONARY ARTERY BYPASS GRAFT    . EYE SURGERY    . HERNIA REPAIR    . JOINT REPLACEMENT    . TONSILLECTOMY    . VASECTOMY     There are no active problems to display for this patient.     Prior to Admission medications   Medication Sig Start Date End Date Taking? Authorizing Provider  atorvastatin (LIPITOR) 80 MG tablet Take 80 mg by mouth daily.    [provider]  carvedilol (COREG) 12.5 MG tablet Take 12.5 mg by mouth 2 (two) times daily with a meal.    [provider]  clindamycin (CLEOCIN) 300 MG capsule Take 300 mg by mouth 3 (three) times daily.    [provider]  diclofenac sodium (VOLTAREN) 1 % GEL Apply 2 g topically 4 (four) times daily.     [provider]  ezetimibe (ZETIA) 10 MG tablet Take 10 mg by mouth daily.    [provider]  hydrochlorothiazide (MICROZIDE) 12.5 MG capsule Take 12.5 mg by mouth daily.    [provider]  levothyroxine (SYNTHROID, LEVOTHROID) 100 MCG tablet Take 100 mcg by mouth daily before breakfast.    [provider]  lisinopril (PRINIVIL,ZESTRIL) 20 MG tablet Take 20 mg by mouth daily.    [provider]  zolpidem (AMBIEN) 10 MG tablet Take 10 mg by mouth at bedtime as needed for sleep.    [provider]    Allergies Bee venom; Crestor [rosuvastatin]; Latex; and Tape    Social History Social History   Tobacco Use  . Smoking status: Never Smoker  . Smokeless tobacco: Former Network engineer Use Topics  . Alcohol use: Yes  . Drug use: No    Review of Systems Patient denies headaches, rhinorrhea, blurry vision, numbness, shortness of breath, chest pain, edema, cough, abdominal pain, nausea, vomiting, diarrhea, dysuria, fevers, rashes or hallucinations unless otherwise stated above in HPI. ____________________________________________   PHYSICAL EXAM:  VITAL SIGNS: Vitals:   10/30/18 0418 10/30/18 0700  BP: (!) 144/75 (!) 146/57  Pulse: 65 (!) 59  Resp: 19 (!) 25  Temp: 98.2 F (36.8 C)  SpO2: 100% 95%    Constitutional: Alert and oriented.  Eyes: Conjunctivae are normal.  Head: Atraumatic. Nose: No congestion/rhinnorhea. Mouth/Throat: Mucous membranes are moist.   Neck: No stridor. Painless ROM.  Cardiovascular: Normal rate, regular rhythm. Grossly normal heart sounds.  Good peripheral circulation. Respiratory: Normal respiratory effort.  No retractions. Lungs CTAB. Gastrointestinal: Soft and nontender. No distention. No abdominal bruits. No CVA tenderness. Genitourinary:  Musculoskeletal: Right lower extremity is cool to the touch to the mid calf.  Slow cap refill.  Unable to palpate PT or DP pulses.  Unable to palpate  popliteal pulse or femoral pulse.  Unable to obtain Doppler signals of PT, DP, pop or femoral on the right.  Neurologic:  Normal speech and language. No gross focal neurologic deficits are appreciated. No facial droop Skin:  Skin is warm, dry and intact. No rash noted. Psychiatric: Mood and affect are normal. Speech and behavior are normal.  ____________________________________________   LABS (all labs ordered are listed, but only abnormal results are displayed)  Results for orders placed or performed during the hospital encounter of 10/30/18 (from the past 24 hour(s))  CBC with Differential/Platelet     Status: None   Collection Time: 10/30/18  4:27 AM  Result Value Ref Range   WBC 7.2 4.0 - 10.5 K/uL   RBC 4.40 4.22 - 5.81 MIL/uL   Hemoglobin 13.4 13.0 - 17.0 g/dL   HCT 40.8 39.0 - 52.0 %   MCV 92.7 80.0 - 100.0 fL   MCH 30.5 26.0 - 34.0 pg   MCHC 32.8 30.0 - 36.0 g/dL   RDW 13.6 11.5 - 15.5 %   Platelets 169 150 - 400 K/uL   nRBC 0.0 0.0 - 0.2 %   Neutrophils Relative % 65 %   Neutro Abs 4.8 1.7 - 7.7 K/uL   Lymphocytes Relative 21 %   Lymphs Abs 1.5 0.7 - 4.0 K/uL   Monocytes Relative 10 %   Monocytes Absolute 0.7 0.1 - 1.0 K/uL   Eosinophils Relative 3 %   Eosinophils Absolute 0.2 0.0 - 0.5 K/uL   Basophils Relative 1 %   Basophils Absolute 0.0 0.0 - 0.1 K/uL   Immature Granulocytes 0 %   Abs Immature Granulocytes 0.02 0.00 - 0.07 K/uL  Basic metabolic panel     Status: Abnormal   Collection Time: 10/30/18  4:27 AM  Result Value Ref Range   Sodium 136 135 - 145 mmol/L   Potassium 5.2 (H) 3.5 - 5.1 mmol/L   Chloride 105 98 - 111 mmol/L   CO2 23 22 - 32 mmol/L   Glucose, Bld 105 (H) 70 - 99 mg/dL   BUN 34 (H) 8 - 23 mg/dL   Creatinine, Ser 1.31 (H) 0.61 - 1.24 mg/dL   Calcium 8.5 (L) 8.9 - 10.3 mg/dL   GFR calc non Af Amer 53 (L) >60 mL/min   GFR calc Af Amer >60 >60 mL/min   Anion gap 8 5 - 15  Protime-INR     Status: None   Collection Time: 10/30/18  4:27 AM   Result Value Ref Range   Prothrombin Time 13.3 11.4 - 15.2 seconds   INR 1.0 0.8 - 1.2  APTT     Status: None   Collection Time: 10/30/18  4:27 AM  Result Value Ref Range   aPTT 30 24 - 36 seconds   ____________________________________________  EKG My review and personal interpretation at Time: 6:31   Indication: cold leg  Rate: 80  Rhythm:  sinus Axis: normal Other: normal intervals, no stemi ____________________________________________  RADIOLOGY  I personally reviewed all radiographic images ordered to evaluate for the above acute complaints and reviewed radiology reports and findings.  These findings were personally discussed with the patient.  Please see medical record for radiology report.  ____________________________________________   PROCEDURES  Procedure(s) performed:  .Critical Care Performed by: Merlyn Lot, MD Authorized by: Merlyn Lot, MD   Critical care provider statement:    Critical care time (minutes):  40   Critical care time was exclusive of:  Separately billable procedures and treating other patients   Critical care was necessary to treat or prevent imminent or life-threatening deterioration of the following conditions:  Circulatory failure   Critical care was time spent personally by me on the following activities:  Development of treatment plan with patient or surrogate, discussions with consultants, evaluation of patient's response to treatment, examination of patient, obtaining history from patient or surrogate, ordering and performing treatments and interventions, ordering and review of laboratory studies, ordering and review of radiographic studies, pulse oximetry, re-evaluation of patient's condition and review of old charts      Critical Care performed: yes ____________________________________________   INITIAL IMPRESSION / [place] / ED COURSE  Pertinent labs & imaging results that were available during my care of the  patient were reviewed by me and considered in my medical decision making (see chart for details).   DDX: embolism, dissection, aneurysm, rest pain  Mario Wolfe is a 73 y.o. who presents to the ED with acutely cold right leg.  Patient with extensive and complex vascular history to the right leg.  Unable to obtain Doppler signals.  Unable to palpate popliteal or femoral pulses.  Will order CT imaging.  Will provide IV morphine.  I am concerned acute limb ischemia.  CT imaging will be ordered to exclude dissection.  Clinical Course as of Oct 30 734  Sat Oct 30, 2018  0642 Still awaiting response from do cover the patient is already been initiated on heparin bolus and drip.  CT imaging does not show any evidence of dissection but does show evidence of complete thrombus occluding from common iliac distally.  Patient's pain is controlled on IV morphine.   [PR]  0721 Have re-paged [facility].   [PR]    Clinical Course User Index [PR] Merlyn Lot, MD     As part of my medical decision making, I reviewed the following data within the [place] notes reviewed and incorporated, Labs reviewed, notes from prior ED visits and [place] Controlled Substance Database   ____________________________________________   FINAL CLINICAL IMPRESSION(S) / ED DIAGNOSES  Final diagnoses:  Critical lower limb ischemia      NEW MEDICATIONS STARTED DURING THIS VISIT:  New Prescriptions   No medications on file     Note:  This document was prepared using Dragon voice recognition software and may include unintentional dictation errors.    Merlyn Lot, MD 10/30/18 [phone]

## 2018-10-30 NOTE — ED Notes (Signed)
emtala reviewed by this RN 

## 2020-08-03 IMAGING — CT CT ANGIOGRAPHY AOBIFEM WITHOUT AND WITH CONTRAST
1 of 15 series · 2 of 16 positions shown, 3 images · IV contrast (APPLIED)
Comparison: None.

CLINICAL DATA: Right foot pain and ischemia. History of peripheral
vascular disease with prior iliac stenting and vascular procedures.

EXAM:
CT ANGIOGRAPHY OF ABDOMINAL AORTA WITH ILIOFEMORAL RUNOFF
TECHNIQUE: Multidetector CT imaging of the abdomen, pelvis and lower
extremities was performed using the standard protocol during bolus
administration of intravenous contrast. Multiplanar CT image
reconstructions and MIPs were obtained to evaluate the vascular
anatomy.
CONTRAST:  125mL [id number] IOPAMIDOL (90C0HZ-SUU) INJECTION 76%

[Series 6: axial arterial upper (person_name) · axial · arterial · 0.98mm/px · z∈[+401,+1388]mm · 2 of 330 slices shown, 3 images]
[im 1/330  soft-tissue]
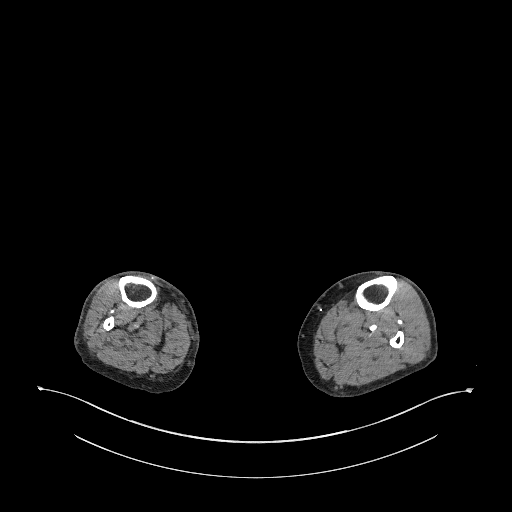
[im 1/330  bone]
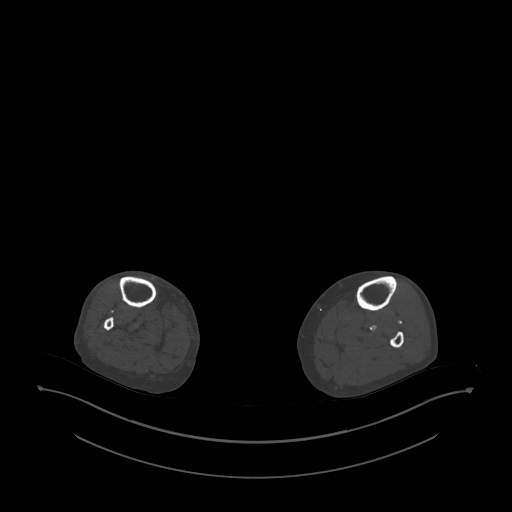
[im 330/330  soft-tissue]
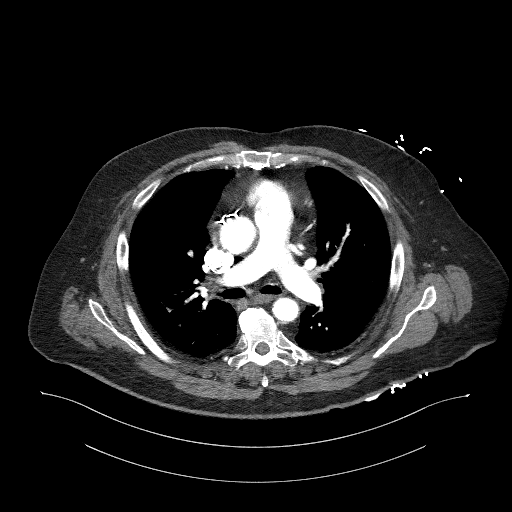

[2 of 16 positions shown; findings below may reference images not displayed]

FINDINGS: VASCULAR

Aorta: No aneurysmal disease, stenosis or occlusion. Calcified
plaque in the distal abdominal aorta.

Celiac: Patent with mild 30-40% origin stenosis. Normally patent
branch vessels.

SMA: Normally patent.

Renals: There are 2 separate right renal arteries. Superior right
renal artery likely stenotic at its origin but difficult to
accurately grade. This is felt to likely be at least 50% narrowed.
The inferior right renal artery likely is not significantly
stenotic. A single left renal artery shows no significant stenosis.

IMA: Normally patent.

RIGHT Lower Extremity

Inflow: The right common iliac artery is occluded shortly beyond its
origin. There is a segment of thrombus prior to a pre-existing
covered stent that spans a large distal common iliac/proximal
external iliac artery aneurysm measuring approximately 7.3 x 4.8 x
6.3 cm. The stent as well as the native aneurysm are thrombosed.
There are embolization coils in a thrombosed proximal right internal
iliac artery. Distal internal iliac branches show some partial
reconstitution by collaterals.

Distal to the covered stent, the native right external iliac artery
is completely occluded. There is some mismatch of stent diameter to
the diameter of the distal external iliac artery which is aneurysmal
and measures approximately 1.7 cm.

Outflow: The common femoral artery, superficial femoral artery and
profunda femoral artery are completely occluded. The native common
femoral and superficial femoral arteries demonstrate diffuse
aneurysmal disease with the thrombosed distal SFA measuring up to
2.3 cm. There appears to also be a partially calcified, old
thrombosed bypass graft extending from the common femoral artery to
the popliteal artery above the knee.

Runoff: The native popliteal artery is occluded. Delayed imaging
demonstrates faint flow in the tibioperoneal trunk which appears to
be reconstituted by small collateral vessels. Distal tibial
opacification is very limited but there does appear to be
potentially some faint runoff at least to the level of the ankle via
the peroneal artery. The distal anterior tibial and posterior tibial
arteries are not opacified.

LEFT Lower Extremity

Inflow: Left iliac arteries show normal patency.

Outflow: Common femoral artery and femoral bifurcation are normally
patent. Profunda femoral artery and SFA demonstrate normal patency.

Runoff: Calcified plaque of the proximal popliteal artery causes
maximal stenosis of approximately 40-50%. Just below the femoral
condyles, the popliteal artery is narrowed approximately 60-70%
focally. Three-vessel runoff is demonstrated into the left foot with
fairly equal caliber anterior tibial and posterior tibial arteries
distally.

Review of the MIP images confirms the above findings.

NON-VASCULAR

Lower chest: Calcified lymph nodes in the inferior right hilar
region and as ago esophageal region are consistent with prior
granulomatous disease. There is a small hiatal hernia.

Hepatobiliary: No focal liver abnormality is seen. No gallstones,
gallbladder wall thickening, or biliary dilatation.

Pancreas: Unremarkable. No pancreatic ductal dilatation or
surrounding inflammatory changes.

Spleen: Normal in size without focal abnormality.

Adrenals/Urinary Tract: Adrenal glands are unremarkable. Kidneys are
normal, without renal calculi, focal lesion, or hydronephrosis. 4 cm
cyst of the interpolar left kidney appears benign. Smaller lower
pole cyst also appears benign. Bladder is unremarkable.

Stomach/Bowel: Bowel shows no evidence of obstruction, ileus or
inflammation. No free air identified.

Lymphatic: No enlarged lymph nodes are identified in the abdomen or
pelvis.

Reproductive: Prostate is unremarkable.

Other: No ascites or abscess. Abdominal wall hernia mesh present
without evidence of hernia.

Musculoskeletal: No acute or significant osseous findings.
IMPRESSION: 1. Occlusion of the native right common iliac artery shortly beyond
its origin. Indwelling covered stent in the common iliac and
external iliac arteries is also occluded and spans a large
thrombosed iliac aneurysm. There also are embolization coils in the
proximal right internal iliac artery which is occluded.
2. The native non stented right distal external iliac, common
femoral, profunda femoral superficial femoral and popliteal arteries
demonstrate complete occlusion. Native external iliac, common
femoral and superficial femoral arteries are diffusely aneurysmal.
3. Old calcified thrombosed bypass graft appears to extend from the
right common femoral artery to the above knee popliteal artery.
4. Faint peroneal runoff after right tibioperoneal trunk
reconstitution. The peroneal artery can be followed to nearly the
ankle by CTA. Anterior tibial and posterior tibial arteries are not
definitely patent.
5. The left lower extremity demonstrates no evidence of prior
intervention. The only disease on the left are focal areas of
popliteal stenosis.
